# Patient Record
Sex: Male | Born: 1970 | Race: Black or African American | Hispanic: No | Smoking: Current every day smoker
Health system: Southern US, Community
[De-identification: ages and names within clinical notes are randomized; demographics above are authoritative.]

## PROBLEM LIST (undated history)

## (undated) HISTORY — PX: GALLBLADDER SURGERY: SHX652

## (undated) HISTORY — PX: VASECTOMY: SHX75

---

## 2000-12-25 ENCOUNTER — Emergency Department (HOSPITAL_COMMUNITY): Admission: EM | Admit: 2000-12-25 | Discharge: 2000-12-25 | Payer: Self-pay | Admitting: *Deleted

## 2002-01-12 ENCOUNTER — Emergency Department (HOSPITAL_COMMUNITY): Admission: EM | Admit: 2002-01-12 | Discharge: 2002-01-12 | Payer: Self-pay | Admitting: Emergency Medicine

## 2002-08-06 ENCOUNTER — Emergency Department (HOSPITAL_COMMUNITY): Admission: EM | Admit: 2002-08-06 | Discharge: 2002-08-06 | Payer: Self-pay | Admitting: *Deleted

## 2002-08-07 ENCOUNTER — Emergency Department (HOSPITAL_COMMUNITY): Admission: EM | Admit: 2002-08-07 | Discharge: 2002-08-07 | Payer: Self-pay | Admitting: Emergency Medicine

## 2002-08-07 ENCOUNTER — Encounter: Payer: Self-pay | Admitting: Emergency Medicine

## 2003-05-10 ENCOUNTER — Emergency Department (HOSPITAL_COMMUNITY): Admission: EM | Admit: 2003-05-10 | Discharge: 2003-05-10 | Payer: Self-pay | Admitting: Emergency Medicine

## 2003-05-11 ENCOUNTER — Ambulatory Visit (HOSPITAL_COMMUNITY): Admission: RE | Admit: 2003-05-11 | Discharge: 2003-05-11 | Payer: Self-pay | Admitting: Emergency Medicine

## 2003-05-11 ENCOUNTER — Encounter: Payer: Self-pay | Admitting: Emergency Medicine

## 2004-07-09 ENCOUNTER — Emergency Department (HOSPITAL_COMMUNITY): Admission: EM | Admit: 2004-07-09 | Discharge: 2004-07-09 | Payer: Self-pay | Admitting: Emergency Medicine

## 2004-08-15 ENCOUNTER — Emergency Department (HOSPITAL_COMMUNITY): Admission: EM | Admit: 2004-08-15 | Discharge: 2004-08-15 | Payer: Self-pay | Admitting: Emergency Medicine

## 2005-02-27 ENCOUNTER — Emergency Department (HOSPITAL_COMMUNITY): Admission: EM | Admit: 2005-02-27 | Discharge: 2005-02-27 | Payer: Self-pay | Admitting: Emergency Medicine

## 2005-02-28 ENCOUNTER — Ambulatory Visit (HOSPITAL_COMMUNITY): Admission: RE | Admit: 2005-02-28 | Discharge: 2005-02-28 | Payer: Self-pay | Admitting: Emergency Medicine

## 2005-02-28 ENCOUNTER — Encounter: Payer: Self-pay | Admitting: Family Medicine

## 2005-03-02 ENCOUNTER — Encounter (INDEPENDENT_AMBULATORY_CARE_PROVIDER_SITE_OTHER): Payer: Self-pay | Admitting: General Surgery

## 2005-03-02 ENCOUNTER — Ambulatory Visit (HOSPITAL_COMMUNITY): Admission: RE | Admit: 2005-03-02 | Discharge: 2005-03-02 | Payer: Self-pay | Admitting: General Surgery

## 2005-05-24 ENCOUNTER — Ambulatory Visit (HOSPITAL_COMMUNITY): Admission: RE | Admit: 2005-05-24 | Discharge: 2005-05-24 | Payer: Self-pay | Admitting: Family Medicine

## 2006-10-14 ENCOUNTER — Ambulatory Visit: Payer: Self-pay | Admitting: Psychiatry

## 2006-10-14 ENCOUNTER — Emergency Department (HOSPITAL_COMMUNITY): Admission: EM | Admit: 2006-10-14 | Discharge: 2006-10-15 | Payer: Self-pay | Admitting: Emergency Medicine

## 2006-10-15 ENCOUNTER — Inpatient Hospital Stay (HOSPITAL_COMMUNITY): Admission: EM | Admit: 2006-10-15 | Discharge: 2006-10-18 | Payer: Self-pay | Admitting: Psychiatry

## 2006-10-27 ENCOUNTER — Emergency Department (HOSPITAL_COMMUNITY): Admission: EM | Admit: 2006-10-27 | Discharge: 2006-10-27 | Payer: Self-pay | Admitting: Emergency Medicine

## 2006-10-28 ENCOUNTER — Ambulatory Visit (HOSPITAL_COMMUNITY): Admission: RE | Admit: 2006-10-28 | Discharge: 2006-10-28 | Payer: Self-pay | Admitting: Emergency Medicine

## 2006-11-11 ENCOUNTER — Ambulatory Visit (HOSPITAL_COMMUNITY): Payer: Self-pay | Admitting: Psychology

## 2006-11-19 ENCOUNTER — Ambulatory Visit (HOSPITAL_COMMUNITY): Payer: Self-pay | Admitting: Psychiatry

## 2006-11-29 ENCOUNTER — Ambulatory Visit (HOSPITAL_COMMUNITY): Payer: Self-pay | Admitting: Psychology

## 2006-12-19 ENCOUNTER — Ambulatory Visit (HOSPITAL_COMMUNITY): Payer: Self-pay | Admitting: Psychiatry

## 2007-09-10 ENCOUNTER — Ambulatory Visit: Payer: Self-pay | Admitting: Cardiology

## 2009-02-19 ENCOUNTER — Emergency Department (HOSPITAL_COMMUNITY): Admission: EM | Admit: 2009-02-19 | Discharge: 2009-02-19 | Payer: Self-pay | Admitting: Emergency Medicine

## 2009-05-30 ENCOUNTER — Emergency Department (HOSPITAL_COMMUNITY): Admission: EM | Admit: 2009-05-30 | Discharge: 2009-05-30 | Payer: Self-pay | Admitting: Emergency Medicine

## 2010-12-22 NOTE — H&P (Signed)
NAMEJOREN, REHM               ACCOUNT NO.:  [id number]   MEDICAL RECORD NO.:  [id number]          PATIENT TYPE:  AMB   LOCATION:  DAY                           FACILITY:  [facility]   PHYSICIAN:  Dalia Heading, M.D.  DATE OF BIRTH:  [birth year]   DATE OF ADMISSION:  DATE OF DISCHARGE:  LH                                HISTORY & PHYSICAL   CHIEF COMPLAINT:  Cholecystitis, cholelithiasis.   HISTORY OF PRESENT ILLNESS:  The patient is a 40 year old black male who  presents with right upper quadrant abdominal pain. He was seen in the  emergency room yesterday and was found to have cholecystitis secondary to  cholelithiasis. He states that this is the first episode, and the pain is  ongoing. Occasional nausea is noted. No significant vomiting is noted. No  fevers or chills have been noted.   PAST MEDICAL HISTORY:  Unremarkable.   PAST SURGICAL HISTORY:  1.  Vasectomy, vasectomy reversal.  2.  Knee surgery.  3.  Foot surgery.   CURRENT MEDICATIONS:  Vicodin as needed for pain.   ALLERGIES:  MOTRIN.   REVIEW OF SYSTEMS:  The patient smokes a pack and a half of cigarettes a  day. Denies any significant alcohol use.   PHYSICAL EXAMINATION:  GENERAL:  The patient is a well-developed, well-  nourished, black male in no acute distress. He is afebrile and vital signs  are stable.  LUNGS:  Clear to auscultation with equal breath sounds bilaterally.  HEART:  Reveals regular rate and rhythm without S3, S4 or murmurs.  ABDOMEN:  Soft with tenderness now in the right upper quadrant to palpation.  No hepatosplenomegaly or masses are noted.   STUDIES:  Ultrasound of gallbladder reveals cholelithiasis with a normal  common bile duct.   IMPRESSION:  Cholecystitis, cholelithiasis.   PLAN:  The patient is scheduled for laparoscopic cholecystectomy on March 02, 2005. The risks and benefits of the procedure including bleeding, infection,  hepatobiliary injury, and the possibility of an open  procedure were fully  explained to the patient, who gave informed consent.       MAJ/MEDQ  D:  03/01/2005  T:  03/01/2005  Job:  [id number]

## 2010-12-22 NOTE — Op Note (Signed)
Bruce Coleman, Bruce Coleman               ACCOUNT NO.:  [id number]   MEDICAL RECORD NO.:  [id number]          PATIENT TYPE:  AMB   LOCATION:  DAY                           FACILITY:  [facility]   PHYSICIAN:  Dalia Heading, M.D.  DATE OF BIRTH:  03-Nov-1970   DATE OF PROCEDURE:  03/02/2005  DATE OF DISCHARGE:                                 OPERATIVE REPORT   PREOPERATIVE DIAGNOSIS:  Cholecystitis, cholelithiasis.   POSTOPERATIVE DIAGNOSIS:  Cholecystitis, cholelithiasis.   PROCEDURE:  Laparoscopic cholecystectomy.   SURGEON:  Dr. Franky Macho.   ASSISTANT:  Dr. Bernerd Limbo. [place].   ANESTHESIA:  General endotracheal.   INDICATIONS:  The patient is a 40 year old black male who presents with  acute cholecystitis secondary to cholelithiasis. Risks and benefits of the  procedure including bleeding, infection, hepatobiliary injury, and the  possibly of an open procedure were fully explained to the patient, who gave  informed consent.   PROCEDURE NOTE:  The patient was placed in supine position. After induction  of general endotracheal anesthesia, the abdomen was prepped and draped in  the usual sterile technique with Betadine. Surgical site confirmation was  performed.   An infraumbilical incision was made down to fascia. Veress needle was  introduced into the abdominal cavity, and confirmation of placement was done  using the saline drop test. The abdomen was then insufflated to 16 mmHg  pressure. An 11-mm trocar was introduced into the abdominal cavity under  direct visualization without difficulty. The patient was placed in reversed  Trendelenburg position. An additional 11-mm trocar was placed in the  epigastric region, and 5-mm trocars were placed in the right upper quadrant  and right flank regions. Liver was inspected and noted to normal limits. The  gallbladder was retracted superior laterally. The dissection was begun  around the infundibulum of the gallbladder. The cystic duct was  first  identified. Its juncture to the infundibulum fully identified. Endoclips  were placed proximally distally on the cystic duct, and cystic duct was  divided. This was likewise done to the cystic artery. The gallbladder then  freed away from the gallbladder fossa using Bovie electrocautery. The  gallbladder was delivered through the epigastric trocar site using an  EndoCatch bag. The gallbladder fossa was inspected. No abnormal bleeding or  bile leakage was noted. Surgicel was placed in the gallbladder fossa. All  fluid and air were then evacuated from the abdominal cavity prior to removal  of the trocars.   All wounds were irrigated with normal saline. All wounds were injected with  0.5% Sensorcaine. The infraumbilical fascia as well as epigastric fascia  were reapproximated using a 0 Vicryl interrupted suture. All skin incisions  were closed using staples. Betadine ointment and dry sterile dressings were  applied.   All tape and needle counts were correct at the end of the procedure. The  patient was extubated in the operating room and went back to recovery room  awake in stable condition.   COMPLICATIONS:  None.   SPECIMEN:  Gallbladder with stones.   BLOOD LOSS:  Minimal.  MAJ/MEDQ  D:  03/02/2005  T:  03/02/2005  Job:  [id number]
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
## 2012-03-22 ENCOUNTER — Emergency Department (HOSPITAL_COMMUNITY)
Admission: EM | Admit: 2012-03-22 | Discharge: 2012-03-22 | Disposition: A | Discharge status: Home / Self Care | Payer: No Typology Code available for payment source | Attending: Emergency Medicine | Admitting: Emergency Medicine

## 2012-03-22 ENCOUNTER — Emergency Department (HOSPITAL_COMMUNITY): Payer: No Typology Code available for payment source

## 2012-03-22 ENCOUNTER — Encounter (HOSPITAL_COMMUNITY): Payer: Self-pay | Admitting: Emergency Medicine

## 2012-03-22 DIAGNOSIS — S8010XA Contusion of unspecified lower leg, initial encounter: Secondary | ICD-10-CM | POA: Insufficient documentation

## 2012-03-22 DIAGNOSIS — S8000XA Contusion of unspecified knee, initial encounter: Secondary | ICD-10-CM

## 2012-03-22 DIAGNOSIS — Y92410 Unspecified street and highway as the place of occurrence of the external cause: Secondary | ICD-10-CM | POA: Insufficient documentation

## 2012-03-22 DIAGNOSIS — F17200 Nicotine dependence, unspecified, uncomplicated: Secondary | ICD-10-CM | POA: Insufficient documentation

## 2012-03-22 DIAGNOSIS — S20219A Contusion of unspecified front wall of thorax, initial encounter: Secondary | ICD-10-CM | POA: Insufficient documentation

## 2012-03-22 MED ORDER — OXYCODONE-ACETAMINOPHEN 5-325 MG PO TABS
1.0000 | ORAL_TABLET | Freq: Once | ORAL | Status: AC
  Administered 2012-03-22: 1 via ORAL
  Filled 2012-03-22: qty 1

## 2012-03-22 MED ORDER — CYCLOBENZAPRINE HCL 10 MG PO TABS: 10.0000 mg | ORAL_TABLET | Freq: Three times a day (TID) | ORAL | Status: AC | PRN

## 2012-03-22 MED ORDER — NAPROXEN 500 MG PO TABS: 500.0000 mg | ORAL_TABLET | Freq: Two times a day (BID) | ORAL | Status: AC

## 2012-03-22 MED ORDER — TRAMADOL-ACETAMINOPHEN 37.5-325 MG PO TABS: 2.0000 | ORAL_TABLET | Freq: Four times a day (QID) | ORAL | Status: AC | PRN

## 2012-03-22 NOTE — ED Notes (Signed)
Patient transported to X-ray 
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
## 2018-05-04 ENCOUNTER — Other Ambulatory Visit: Payer: Self-pay

## 2018-05-04 ENCOUNTER — Emergency Department (HOSPITAL_COMMUNITY): Payer: Self-pay

## 2018-05-04 ENCOUNTER — Encounter (HOSPITAL_COMMUNITY): Payer: Self-pay | Admitting: Emergency Medicine

## 2018-05-04 ENCOUNTER — Emergency Department (HOSPITAL_COMMUNITY)
Admission: EM | Admit: 2018-05-04 | Discharge: 2018-05-04 | Disposition: A | Discharge status: Home / Self Care | Payer: Self-pay | Attending: Emergency Medicine | Admitting: Emergency Medicine

## 2018-05-04 DIAGNOSIS — M545 Low back pain, unspecified: Secondary | ICD-10-CM

## 2018-05-04 DIAGNOSIS — F17210 Nicotine dependence, cigarettes, uncomplicated: Secondary | ICD-10-CM | POA: Insufficient documentation

## 2018-05-04 DIAGNOSIS — N390 Urinary tract infection, site not specified: Secondary | ICD-10-CM | POA: Insufficient documentation

## 2018-05-04 DIAGNOSIS — Z79899 Other long term (current) drug therapy: Secondary | ICD-10-CM | POA: Insufficient documentation

## 2018-05-04 LAB — URINALYSIS, ROUTINE W REFLEX MICROSCOPIC
Bilirubin Urine: NEGATIVE
GLUCOSE, UA: NEGATIVE mg/dL
Ketones, ur: NEGATIVE mg/dL
LEUKOCYTES UA: NEGATIVE
Nitrite: POSITIVE — AB
Protein, ur: NEGATIVE mg/dL
SPECIFIC GRAVITY, URINE: 1.032 — AB (ref 1.005–1.030)
pH: 5 (ref 5.0–8.0)

## 2018-05-04 LAB — CBG MONITORING, ED
GLUCOSE-CAPILLARY: 136 mg/dL — AB (ref 70–99)
Glucose-Capillary: 71 mg/dL (ref 70–99)

## 2018-05-04 MED ORDER — STERILE WATER FOR INJECTION IJ SOLN
INTRAMUSCULAR | Status: AC
  Administered 2018-05-04: 1 mL
  Filled 2018-05-04: qty 10

## 2018-05-04 MED ORDER — CEFTRIAXONE SODIUM 250 MG IJ SOLR
250.0000 mg | Freq: Once | INTRAMUSCULAR | Status: AC
  Administered 2018-05-04: 250 mg via INTRAMUSCULAR
  Filled 2018-05-04: qty 250

## 2018-05-04 MED ORDER — DOXYCYCLINE HYCLATE 100 MG PO TABS
100.0000 mg | ORAL_TABLET | Freq: Once | ORAL | Status: AC
  Administered 2018-05-04: 100 mg via ORAL
  Filled 2018-05-04: qty 1

## 2018-05-04 MED ORDER — DOXYCYCLINE HYCLATE 100 MG PO CAPS: 100.0000 mg | ORAL_CAPSULE | Freq: Two times a day (BID) | ORAL | 0 refills | Status: AC

## 2018-05-04 NOTE — ED Provider Notes (Signed)
[facility] EMERGENCY DEPARTMENT Provider Note   CSN: [id number] Arrival date & time: 05/04/18  1300     History   Chief Complaint Chief Complaint  Patient presents with  . Back Pain    HPI Bruce Coleman is a 47 y.o. male.  HPI   He is here for evaluation of bilateral lower back pain which radiates to the left anterior thigh.  He has also noticed occasional urinary urgency but no urinary frequency, hematuria or urinary hesitation.  He denies flank pain.  He denies fever, chills, nausea, vomiting.  Times he feels more sleepy than usual.  He is not currently working.  No prior similar problems.  No known trauma.  There are no other known modifying factors.  History reviewed. No pertinent past medical history.  There are no active problems to display for this patient.   Past Surgical History:  Procedure Laterality Date  . GALLBLADDER SURGERY    . VASECTOMY          Home Medications    Prior to Admission medications   Medication Sig Start Date End Date Taking? Authorizing Provider  naproxen sodium (ANAPROX) 220 MG tablet Take 220 mg by mouth 2 (two) times daily with a meal.   Yes [provider]  doxycycline (VIBRAMYCIN) 100 MG capsule Take 1 capsule (100 mg total) by mouth 2 (two) times daily. One po bid x 14 days 05/04/18   Mancel Bale, MD    Family History History reviewed. No pertinent family history.  Social History Social History   Tobacco Use  . Smoking status: Current Every Day Smoker    Packs/day: 0.25    Years: 20.00    Pack years: 5.00    Types: Cigarettes  . Smokeless tobacco: Never Used  Substance Use Topics  . Alcohol use: No  . Drug use: Yes    Types: Marijuana     Allergies   Motrin [ibuprofen]   Review of Systems Review of Systems  All other systems reviewed and are negative.    Physical Exam Updated Vital Signs BP 108/70   Pulse (!) 53   Temp (!) 97.5 F (36.4 C) (Oral) Comment (Src): Patient has been drinking  cold soda  Resp 18   SpO2 100%   Physical Exam  Constitutional: He is oriented to person, place, and time. He appears well-developed and well-nourished. No distress.  HENT:  Head: Normocephalic and atraumatic.  Right Ear: External ear normal.  Left Ear: External ear normal.  Eyes: Pupils are equal, round, and reactive to light. Conjunctivae and EOM are normal.  Neck: Normal range of motion and phonation normal. Neck supple.  Cardiovascular: Normal rate.  Pulmonary/Chest: Effort normal. He exhibits no bony tenderness.  Musculoskeletal: Normal range of motion.  Mild lumbar tenderness without deformity.  Neurological: He is alert and oriented to person, place, and time. No cranial nerve deficit or sensory deficit. He exhibits normal muscle tone. Coordination normal.  Skin: Skin is warm, dry and intact.  Psychiatric: He has a normal mood and affect. His behavior is normal. Judgment and thought content normal.  Nursing note and vitals reviewed.    ED Treatments / Results  Labs (all labs ordered are listed, but only abnormal results are displayed) Labs Reviewed  URINALYSIS, ROUTINE W REFLEX MICROSCOPIC - Abnormal; Notable for the following components:      Result Value   APPearance HAZY (*)    Specific Gravity, Urine 1.032 (*)    Hgb urine dipstick SMALL (*)  Nitrite POSITIVE (*)    Bacteria, UA MANY (*)    All other components within normal limits  CBG MONITORING, ED - Abnormal; Notable for the following components:   Glucose-Capillary 136 (*)    All other components within normal limits  CBG MONITORING, ED    EKG None  Radiology Dg Lumbar Spine Complete  Result Date: 05/04/2018 CLINICAL DATA:  Chronic low back pain without known injury. EXAM: LUMBAR SPINE - COMPLETE 4+ VIEW COMPARISON:  None. FINDINGS: There is no evidence of lumbar spine fracture. Alignment is normal. Intervertebral disc spaces are maintained. IMPRESSION: Negative. Electronically Signed   By: Lupita Raider, M.D.   On: 05/04/2018 17:09    Procedures Procedures (including critical care time)  Medications Ordered in ED Medications  cefTRIAXone (ROCEPHIN) injection 250 mg (250 mg Intramuscular Given 05/04/18 1740)  doxycycline (VIBRA-TABS) tablet 100 mg (100 mg Oral Given 05/04/18 1740)  sterile water (preservative free) injection (1 mL  Given 05/04/18 1740)     Initial Impression / Assessment and Plan / ED Course  I have reviewed the triage vital signs and the nursing notes.  Pertinent labs & imaging results that were available during my care of the patient were reviewed by me and considered in my medical decision making (see chart for details).  Clinical Course as of May 04 1746  Wynelle Link May 04, 2018  1704 Abnormal, nitrite present, elevated WBC and bacteria  Urinalysis, Routine w reflex microscopic(!) [EW]  1705 Mild lumbar degenerative changes, increased fecal burden, images reviewed by me.  DG Lumbar Spine Complete [EW]  1721 Normal  POC CBG, ED [EW]    Clinical Course User Index [EW] Mancel Bale, MD     Patient Vitals for the past 24 hrs:  BP Temp Temp src Pulse Resp SpO2  05/04/18 1600 108/70 - - (!) 53 - -  05/04/18 1352 133/89 (!) 97.5 F (36.4 C) Oral 60 18 100 %    5:21 PM Reevaluation with update and discussion. After initial assessment and treatment, an updated evaluation reveals he remains comfortable has no further complaints.  Findings discussed with the patient and all questions were answered. Mancel Bale   Medical Decision Making: UTI, etiology not clear.  Back pain is nonspecific and may be related to musculoskeletal pain versus UTI.  Doubt lumbar myelopathy.  Doubt sepsis or impending vascular collapse.  CRITICAL CARE-no Performed by: Mancel Bale  Nursing Notes Reviewed/ Care Coordinated Applicable Imaging Reviewed Interpretation of Laboratory Data incorporated into ED treatment  The patient appears reasonably screened and/or stabilized for  discharge and I doubt any other medical condition or other [facility] requiring further screening, evaluation, or treatment in the ED at this time prior to discharge.  Plan: Home Medications-OTC analgesia of choice; Home Treatments-rest, fluids; return here if the recommended treatment, does not improve the symptoms; Recommended follow up-urology follow-up 1 to 2 weeks, return here if needed.   Final Clinical Impressions(s) / ED Diagnoses   Final diagnoses:  Urinary tract infection without hematuria, site unspecified  Low back pain without sciatica, unspecified back pain laterality, unspecified chronicity    ED Discharge Orders         Ordered    doxycycline (VIBRAMYCIN) 100 MG capsule  2 times daily     05/04/18 1745           Mancel Bale, MD 05/04/18 [id number]

## 2018-05-04 NOTE — ED Triage Notes (Signed)
Blood sugar 136

## 2018-05-04 NOTE — ED Triage Notes (Signed)
Patient c/o intermittent mid and lower back pain x4 months. Denies any injuryPer patient orange color to urine with a sweet to sour odor. Denies dysuria. Denies any fevers.

## 2018-05-04 NOTE — Discharge Instructions (Signed)
Make sure you are getting plenty of rest and drinking 1 to 2 L of water each day.  Start the antibiotic prescription tomorrow morning.  Take it until its gone.  Call the urologist for follow-up appointment in 1 to 2 weeks.  Pain, use ibuprofen 2 pills every 6-8 hours.

## 2018-05-16 ENCOUNTER — Other Ambulatory Visit: Payer: Self-pay

## 2018-05-16 ENCOUNTER — Emergency Department (HOSPITAL_COMMUNITY): Payer: Self-pay

## 2018-05-16 ENCOUNTER — Encounter (HOSPITAL_COMMUNITY): Payer: Self-pay | Admitting: Emergency Medicine

## 2018-05-16 ENCOUNTER — Emergency Department (HOSPITAL_COMMUNITY)
Admission: EM | Admit: 2018-05-16 | Discharge: 2018-05-16 | Disposition: A | Discharge status: Home / Self Care | Payer: Self-pay | Attending: Emergency Medicine | Admitting: Emergency Medicine

## 2018-05-16 DIAGNOSIS — M545 Low back pain, unspecified: Secondary | ICD-10-CM

## 2018-05-16 DIAGNOSIS — M79672 Pain in left foot: Secondary | ICD-10-CM | POA: Insufficient documentation

## 2018-05-16 DIAGNOSIS — F17210 Nicotine dependence, cigarettes, uncomplicated: Secondary | ICD-10-CM | POA: Insufficient documentation

## 2018-05-16 DIAGNOSIS — G8929 Other chronic pain: Secondary | ICD-10-CM

## 2018-05-16 DIAGNOSIS — M79671 Pain in right foot: Secondary | ICD-10-CM | POA: Insufficient documentation

## 2018-05-16 DIAGNOSIS — Z79899 Other long term (current) drug therapy: Secondary | ICD-10-CM | POA: Insufficient documentation

## 2018-05-16 LAB — URINALYSIS, ROUTINE W REFLEX MICROSCOPIC
BILIRUBIN URINE: NEGATIVE
GLUCOSE, UA: NEGATIVE mg/dL
Hgb urine dipstick: NEGATIVE
KETONES UR: NEGATIVE mg/dL
Leukocytes, UA: NEGATIVE
Nitrite: NEGATIVE
PH: 7 (ref 5.0–8.0)
Protein, ur: NEGATIVE mg/dL
SPECIFIC GRAVITY, URINE: 1.016 (ref 1.005–1.030)

## 2018-05-16 MED ORDER — METHOCARBAMOL 500 MG PO TABS: 500.0000 mg | ORAL_TABLET | Freq: Two times a day (BID) | ORAL | 0 refills | Status: AC

## 2018-05-16 MED ORDER — MELOXICAM 7.5 MG PO TABS: 7.5000 mg | ORAL_TABLET | Freq: Every day | ORAL | 0 refills | Status: AC

## 2018-05-16 NOTE — ED Provider Notes (Signed)
[facility] EMERGENCY DEPARTMENT Provider Note   CSN: [id number] Arrival date & time: 05/16/18  1108     History   Chief Complaint Chief Complaint  Patient presents with  . Back Pain    HPI Bruce Coleman is a 47 y.o. male.  The history is provided by the patient. No language interpreter was used.  Back Pain   This is a recurrent problem. The current episode started more than 1 week ago. The problem occurs constantly. The problem has been gradually worsening. The pain is associated with no known injury. The pain is present in the lumbar spine. The quality of the pain is described as aching. The pain is moderate. The pain is the same all the time. Pertinent negatives include no abdominal pain and no weakness. He has tried nothing for the symptoms. The treatment provided no relief.  Pt reports he has had pain in his feet for years.  Pt reports he has had back pain for over 2 weeks.  Pt reports he was seen here and treated for a uti.  Pt reports now pain is higher in his back.  Pt complains of a cough.    History reviewed. No pertinent past medical history.  There are no active problems to display for this patient.   Past Surgical History:  Procedure Laterality Date  . GALLBLADDER SURGERY    . VASECTOMY          Home Medications    Prior to Admission medications   Medication Sig Start Date End Date Taking? Authorizing Provider  doxycycline (VIBRAMYCIN) 100 MG capsule Take 1 capsule (100 mg total) by mouth 2 (two) times daily. One po bid x 14 days 05/04/18   Mancel Bale, MD  meloxicam (MOBIC) 7.5 MG tablet Take 1 tablet (7.5 mg total) by mouth daily. 05/16/18   Elson Areas, PA-C  methocarbamol (ROBAXIN) 500 MG tablet Take 1 tablet (500 mg total) by mouth 2 (two) times daily. 05/16/18   Elson Areas, PA-C  naproxen sodium (ANAPROX) 220 MG tablet Take 220 mg by mouth 2 (two) times daily with a meal.    [provider]    Family History History reviewed.  No pertinent family history.  Social History Social History   Tobacco Use  . Smoking status: Current Every Day Smoker    Packs/day: 0.25    Years: 20.00    Pack years: 5.00    Types: Cigarettes  . Smokeless tobacco: Never Used  Substance Use Topics  . Alcohol use: No  . Drug use: Yes    Types: Marijuana     Allergies   Motrin [ibuprofen]   Review of Systems Review of Systems  Gastrointestinal: Negative for abdominal pain.  Musculoskeletal: Positive for back pain.  Neurological: Negative for weakness.  All other systems reviewed and are negative.    Physical Exam Updated Vital Signs BP 132/78 (BP Location: Right Arm)   Pulse 73   Temp 97.8 F (36.6 C) (Oral)   Resp 16   Ht 6\' 4"  (1.93 m)   Wt 88.5 kg   SpO2 100%   BMI 23.74 kg/m   Physical Exam  Constitutional: He appears well-developed and well-nourished.  HENT:  Head: Normocephalic and atraumatic.  Eyes: Conjunctivae are normal.  Neck: Neck supple.  Cardiovascular: Normal rate and regular rhythm.  No murmur heard. Pulmonary/Chest: Effort normal and breath sounds normal. No respiratory distress.  Abdominal: Soft. There is no tenderness.  Musculoskeletal: Normal range of motion. He  exhibits no edema.  Tender thoracic and lumbar spine.  Tender bilat feet.   Neurological: He is alert.  Skin: Skin is warm and dry.  Psychiatric: He has a normal mood and affect.  Nursing note and vitals reviewed.    ED Treatments / Results  Labs (all labs ordered are listed, but only abnormal results are displayed) Labs Reviewed  URINALYSIS, ROUTINE W REFLEX MICROSCOPIC - Abnormal; Notable for the following components:      Result Value   APPearance CLOUDY (*)    All other components within normal limits    EKG None  Radiology Dg Chest 2 View  Result Date: 05/16/2018 CLINICAL DATA:  Shortness of breath EXAM: CHEST - 2 VIEW COMPARISON:  March 22, 2012 FINDINGS: There is no edema or consolidation. The heart  size and pulmonary vascularity are normal. No adenopathy. No bone lesions. IMPRESSION: No edema or consolidation. Electronically Signed   By: Bretta Bang III M.D.   On: 05/16/2018 11:58    Procedures Procedures (including critical care time)  Medications Ordered in ED Medications - No data to display   Initial Impression / Assessment and Plan / ED Course  I have reviewed the triage vital signs and the nursing notes.  Pertinent labs & imaging results that were available during my care of the patient were reviewed by me and considered in my medical decision making (see chart for details).     Pt has chronic foot pain.  He has had inserts in the past but does not currently have.  Pt given number for Podiatrist to follow up with,  Urine rechecked, infection has resolved.Chest xray is normal.   I will try pt on robaxin and meloxicam.  Pt is advised to schedule primary care recheck.   Final Clinical Impressions(s) / ED Diagnoses   Final diagnoses:  Acute bilateral low back pain without sciatica  Chronic pain of both feet    ED Discharge Orders         Ordered    methocarbamol (ROBAXIN) 500 MG tablet  2 times daily     05/16/18 1242    meloxicam (MOBIC) 7.5 MG tablet  Daily     05/16/18 1242           Elson Areas, [place] 05/16/18 1438    Doug Sou, MD 05/16/18 1651

## 2018-05-16 NOTE — ED Triage Notes (Signed)
Patient complaining of back pain x 4-5 days. Denies injury.

## 2018-05-16 NOTE — Discharge Instructions (Signed)
Schedule appointment with primary MD for evaluation  Ice to area of pain

## 2019-04-28 IMAGING — DX DG CHEST 2V
2 series · 2 of 2 positions shown · non-contrast
Comparison: March 22, 2012

CLINICAL DATA: Shortness of breath

EXAM:
CHEST - 2 VIEW

[chest pa]
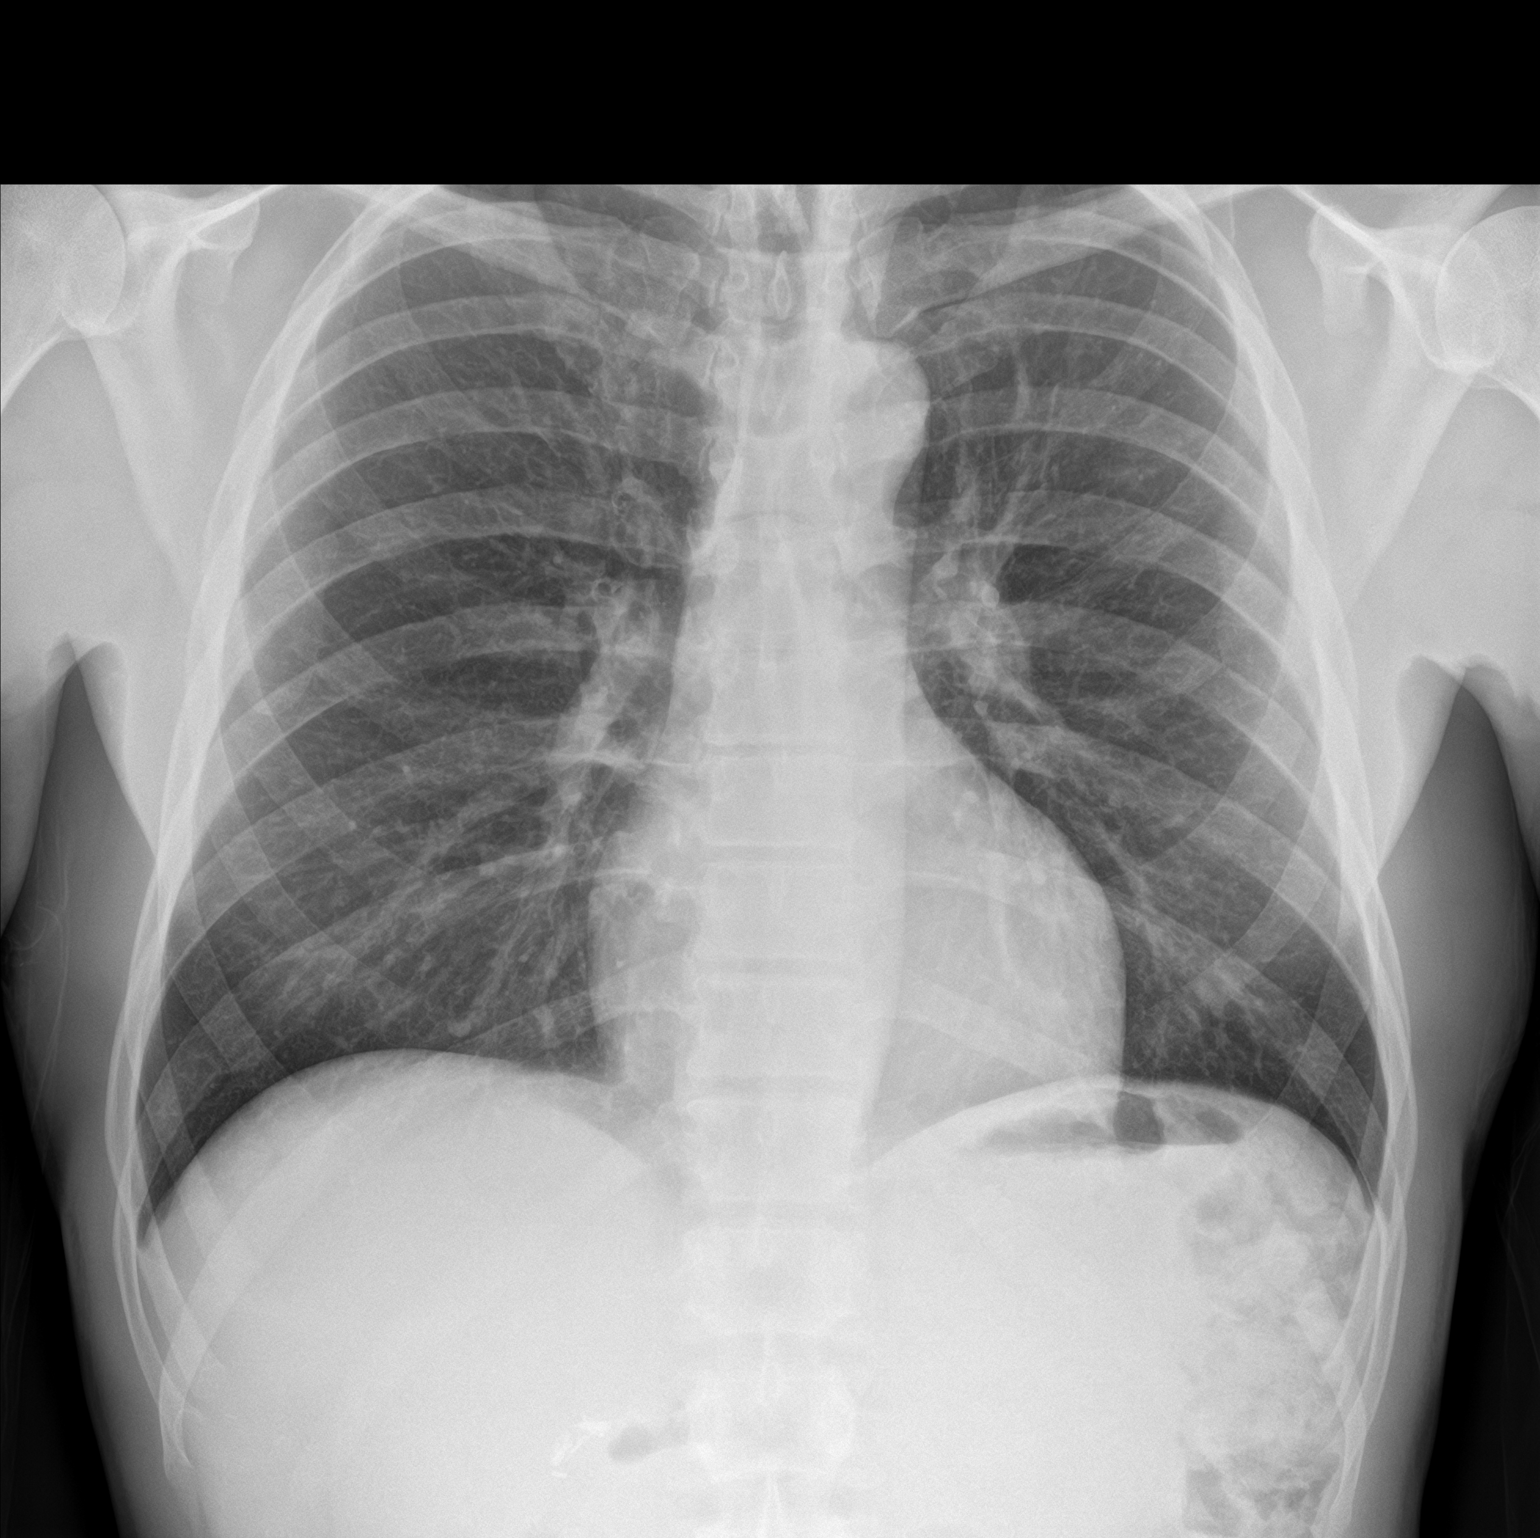

[chest lat]
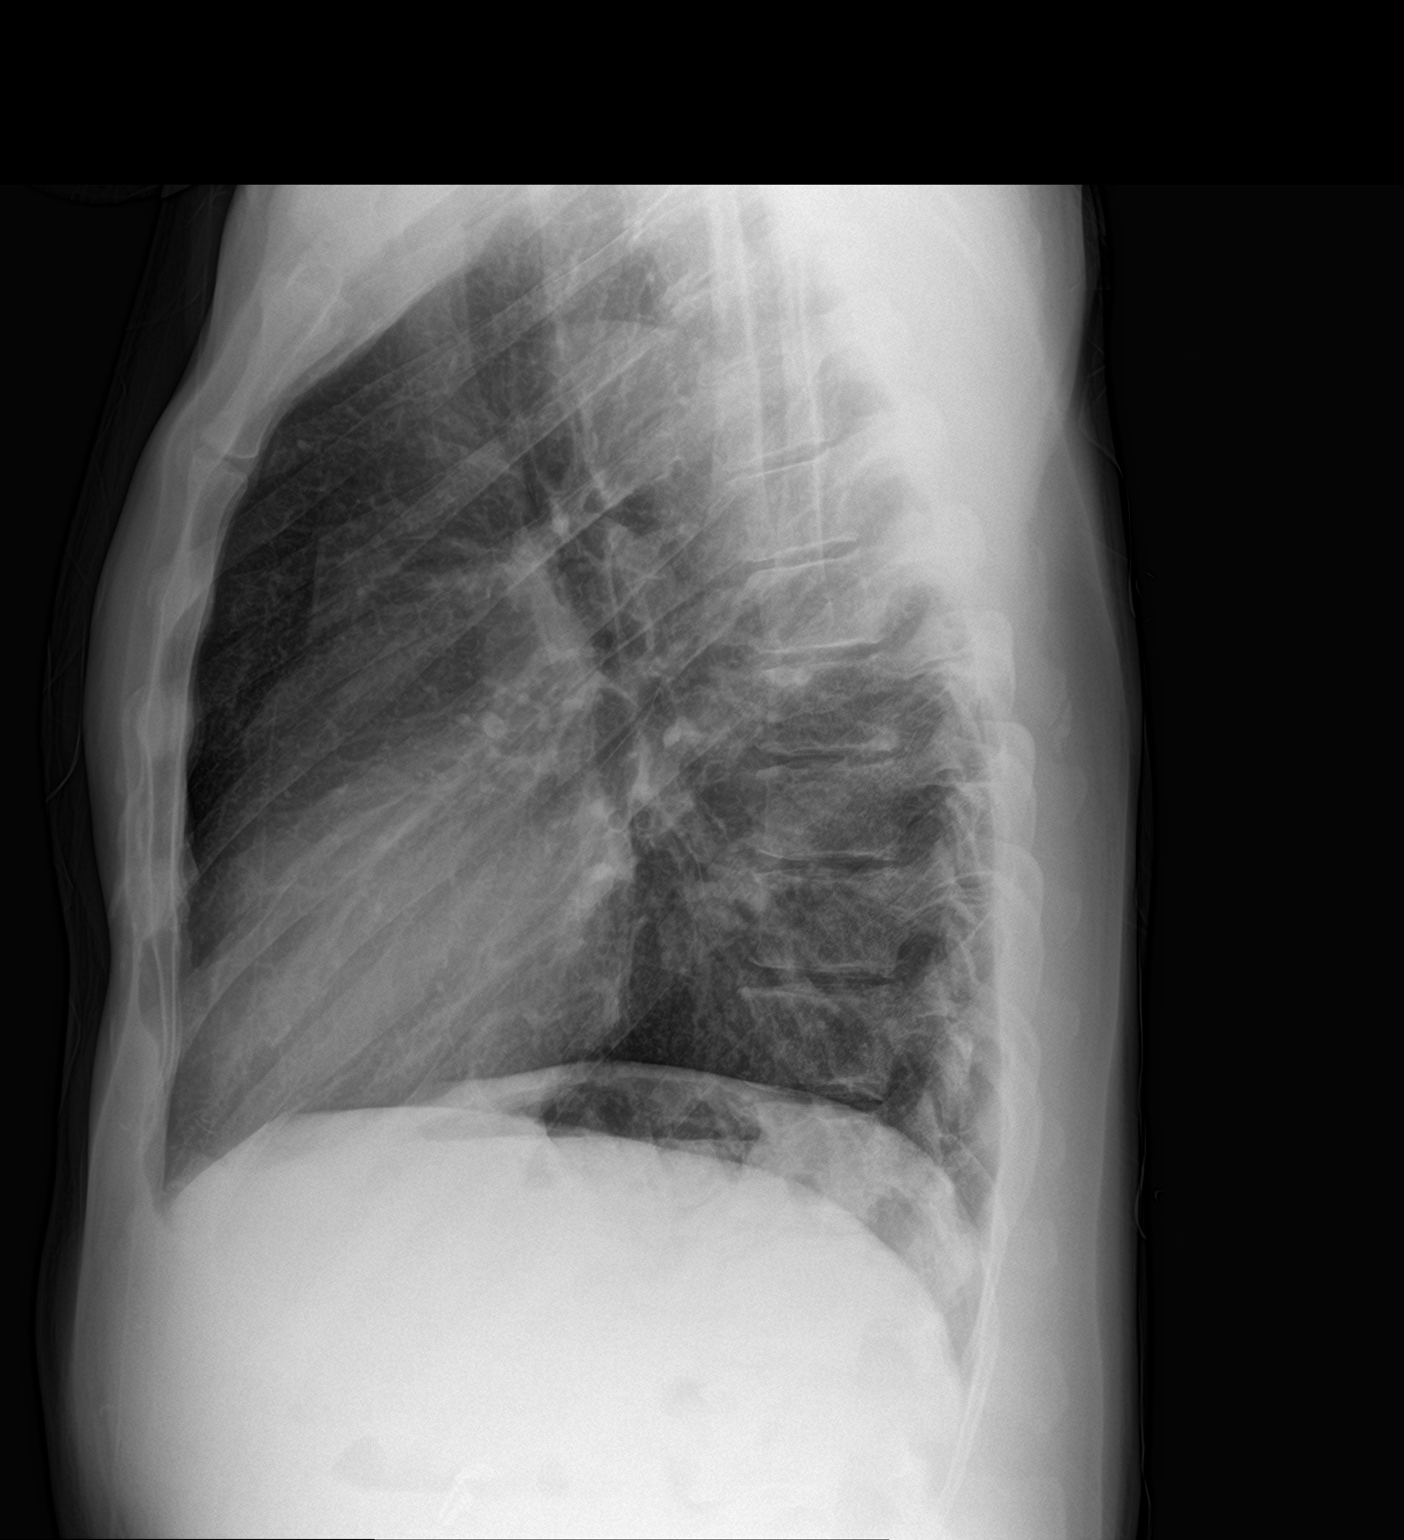

[2 of 2 positions shown; findings below may reference images not displayed]

FINDINGS: There is no edema or consolidation. The heart size and pulmonary
vascularity are normal. No adenopathy. No bone lesions.
IMPRESSION: No edema or consolidation.
